# Patient Record
Sex: Male | Born: 1940 | Race: White | Hispanic: No | Marital: Married
Health system: Southern US, Community
[De-identification: ages and names within clinical notes are randomized; demographics above are authoritative.]

---

## 1999-10-23 ENCOUNTER — Encounter: Payer: Self-pay | Admitting: Family Medicine

## 1999-10-23 ENCOUNTER — Ambulatory Visit (HOSPITAL_COMMUNITY): Admission: RE | Admit: 1999-10-23 | Discharge: 1999-10-23 | Payer: Self-pay | Admitting: Family Medicine

## 2004-10-15 ENCOUNTER — Ambulatory Visit: Payer: Self-pay | Admitting: Family Medicine

## 2005-05-30 ENCOUNTER — Ambulatory Visit: Payer: Self-pay | Admitting: Family Medicine

## 2006-02-03 ENCOUNTER — Ambulatory Visit: Payer: Self-pay | Admitting: Family Medicine

## 2006-02-13 ENCOUNTER — Ambulatory Visit: Payer: Self-pay | Admitting: Family Medicine

## 2008-05-24 ENCOUNTER — Encounter: Admission: RE | Admit: 2008-05-24 | Discharge: 2008-05-24 | Payer: Self-pay

## 2010-08-29 ENCOUNTER — Ambulatory Visit: Payer: Self-pay | Admitting: Thoracic Surgery

## 2010-08-29 ENCOUNTER — Encounter: Admission: RE | Admit: 2010-08-29 | Discharge: 2010-08-29 | Payer: Self-pay | Admitting: Thoracic Surgery

## 2010-09-14 ENCOUNTER — Ambulatory Visit: Payer: Self-pay | Admitting: Thoracic Surgery

## 2010-09-14 ENCOUNTER — Ambulatory Visit (HOSPITAL_COMMUNITY): Admission: RE | Admit: 2010-09-14 | Discharge: 2010-09-14 | Payer: Self-pay | Admitting: Thoracic Surgery

## 2010-09-18 ENCOUNTER — Ambulatory Visit: Payer: Self-pay | Admitting: Thoracic Surgery

## 2010-09-25 ENCOUNTER — Ambulatory Visit (HOSPITAL_COMMUNITY): Admission: RE | Admit: 2010-09-25 | Discharge: 2010-09-25 | Payer: Self-pay | Admitting: Thoracic Surgery

## 2010-10-03 ENCOUNTER — Ambulatory Visit: Payer: Self-pay | Admitting: Thoracic Surgery

## 2010-10-17 ENCOUNTER — Ambulatory Visit
Admission: RE | Admit: 2010-10-17 | Discharge: 2010-11-21 | Payer: Self-pay | Source: Home / Self Care | Attending: Radiation Oncology | Admitting: Radiation Oncology

## 2011-01-03 ENCOUNTER — Ambulatory Visit: Payer: Self-pay | Admitting: Radiation Oncology

## 2011-01-24 ENCOUNTER — Ambulatory Visit: Payer: Medicare Other | Admitting: Radiation Oncology

## 2011-02-05 LAB — APTT: aPTT: 32 seconds (ref 24–37)

## 2011-02-05 LAB — CBC
HCT: 45.2 % (ref 39.0–52.0)
Hemoglobin: 15.2 g/dL (ref 13.0–17.0)
MCV: 95 fL (ref 78.0–100.0)
RBC: 4.76 MIL/uL (ref 4.22–5.81)
RDW: 14.3 % (ref 11.5–15.5)
WBC: 10.5 10*3/uL (ref 4.0–10.5)

## 2011-02-06 LAB — AFB CULTURE WITH SMEAR (NOT AT ARMC): Acid Fast Smear: NONE SEEN

## 2011-02-06 LAB — COMPREHENSIVE METABOLIC PANEL
ALT: 16 U/L (ref 0–53)
AST: 27 U/L (ref 0–37)
Alkaline Phosphatase: 75 U/L (ref 39–117)
CO2: 29 mEq/L (ref 19–32)
Chloride: 104 mEq/L (ref 96–112)
GFR calc Af Amer: >60 mL/min (ref >60)
GFR calc non Af Amer: 56 mL/min — ABNORMAL LOW (ref >60)
Glucose, Bld: 113 mg/dL — ABNORMAL HIGH (ref 70–99)
Potassium: 4.3 mEq/L (ref 3.5–5.1)
Sodium: 140 mEq/L (ref 135–145)

## 2011-02-06 LAB — CULTURE, RESPIRATORY W GRAM STAIN

## 2011-02-06 LAB — CBC
HCT: 43.3 % (ref 39.0–52.0)
Hemoglobin: 14.5 g/dL (ref 13.0–17.0)
RBC: 4.57 MIL/uL (ref 4.22–5.81)
WBC: 10.3 10*3/uL (ref 4.0–10.5)

## 2011-02-06 LAB — FUNGUS CULTURE W SMEAR

## 2011-02-06 LAB — GLUCOSE, CAPILLARY: Glucose-Capillary: 130 mg/dL — ABNORMAL HIGH (ref 70–99)

## 2011-02-06 LAB — PROTIME-INR: Prothrombin Time: 12.8 seconds (ref 11.6–15.2)

## 2011-04-09 NOTE — Letter (Signed)
August 29, 2010   Tanvir A. Chodri, MD  [address], [place] [address]   Re:  Tyler Hardy, Tyler Hardy                DOB:  December 18, 1940   Dear Dr. Blenda Nicely:   I appreciate the opportunity of seeing the patient.  This 70 year old  patient was admitted to the hospital in early September with marked  dyspnea with marked bradycardia.  He was apparently in the ICU for  approximately 8 days and then was transferred to the floor and then to a  nursing home with a diagnosis of chronic obstructive pulmonary disease,  tobacco abuse, cardiomegaly, congestive heart failure, and hypertension.  He had pulmonary function tests that showed an FVC of 2.05 which was 31%  of predicted and an FEV1 which is 1.58 which is also 32% of predicted.  He continues to smoke up to a pack a day.  His diffusion capacity was  not done.  He also has been treated for sleep apnea.  At the time of his  admission to the hospital, a CT scan showed a 1.6-cm lesion in the left  upper lobe. A PET scan was done at [facility], which showed no evidence  of spread to the hilar or mediastinal nodes.  He has had no hemoptysis,  fever, chills, or excessive sputum.   MEDICATIONS:  Albuterol, Flexeril, hydralazine, isosorbide, Lasix,  Lovenox, Lidoderm patch, metformin, Neurontin, Novolin, omeprazole,  hydrochlorothiazide, Seroquel, simvastatin, Spiriva, vitamin D, Zoloft,  Indocin, oxycodone, Ativan, and Senokot.   PAST MEDICAL HISTORY:  In addition to the above diagnoses, he has got  diabetes mellitus type 2, chronic back and foot pain, and chronic venous  disease.   FAMILY HISTORY:  Noncontributory.   SOCIAL HISTORY:  He is disabled.  He smokes a pack and half cigarettes a  day.  Does not drink alcohol on a regular basis.   REVIEW OF SYSTEMS:  VITAL SIGNS:  He is 230 pounds.  He is 6 feet.  GENERAL:  His weight has been stable.  CARDIAC:  See history of present illness.  PULMONARY:  No hemoptysis.  See history of present  illness.  GI:  No nausea, vomiting, or constipation, but has reflux.  GU:  No kidney disease, dysuria, or frequent urination.  VASCULAR:  Has venous stasis disease and no TIAs.  NEUROLOGICAL:  No dizziness, headaches, blackouts, or seizures.  MUSCULOSKELETAL:  He has got multiple back and foot pain.  PSYCHIATRIC:  No depression or nervousness.  EYE/ENT:  No changes in eyesight or hearing.  HEMATOLOGICAL:  No problems with bleeding or clotting disorders, but he  is on Lovenox.  No anemia.   PHYSICAL EXAMINATION:  General:  He is an obese Caucasian male, in no  acute distress.  Vital Signs:  His blood pressure is 156/82, pulse 88,  respirations were 18, sats were 94%.  Head, Eyes, Ears, Nose, and  Throat:  Unremarkable.  Chest:  Increased AP diameter and bilateral  wheezes.  Heart:  Regular, sinus rhythm.  Abdomen:  Obese.  Bowel sounds  are decreased.  Extremities:  There is 2+ edema with marked venous  stasis changes bilaterally, right greater than left, 1+ pulses.  Neurological:  He is oriented x3.  Grossly sensory and motor intact.   I feel that the patient is not an operative candidate given his multiple  medical problems particularly with his chronic obstructive pulmonary  disease, cardiac disease, and his morbid obesity.  The only treatment  for this would be with SBRT.  I will plan to try and get a diagnosis  with electromagnetic navigation bronchoscopy.  If that is unsuccessful,  then hopefully we might be able to do a needle biopsy.  We will proceed  with the electromagnetic bronchoscopy on the 21st.  I appreciate the  opportunity of seeing the patient.   Sincerely,   Ines Bloomer, M.D.  Electronically Signed   DPB/MEDQ  D:  08/29/2010  T:  08/30/2010  Job:  [id number]   cc:   Molly Maduro L. Sol Passer, MD

## 2011-04-09 NOTE — Letter (Signed)
October 03, 2010   Tyler Hardy, M.D.  [address]  [place], [place]. [address]   Re:  [name], [name]                DOB:  Jun 03, 1941   Dear Dr. Blenda Hardy:   I saw the patient back today after his needle biopsy.  Fortunately we  were able to give diagnosis and he had no complications with needle  biopsy.  He has a high-grade poorly differentiated neuroendocrine tumor,  which is an intermediate grade between a large cell and a small cell.  So, it needed to be treated with radiation and chemotherapy.  I will  refer him to [facility] to initiate treatment.  His blood  pressure is 175/91, pulse 78, respirations 18, saturations were 97%.   Ines Bloomer, M.D.  Electronically Signed   DPB/MEDQ  D:  10/03/2010  T:  10/04/2010  Job:  [id number]

## 2011-04-09 NOTE — Letter (Signed)
September 18, 2010   Tanvir A. Chodri, MD  [address]  [place], [place]. [address]   Re:  Tyler Hardy, Tyler Hardy                DOB:  08/01/1941   Dear Dr. Blenda Nicely:   I saw the patient back after his bronchoscopy with electromagnetic  navigation, and unfortunately we were not able to obtain a diagnosis and  so I am referring him for a needle biopsy.  He did have strep pneumonia,  so I recommended that he be started on antibiotics for that.  He  tolerated the bronchoscopy well, but unfortunately we were not able to  obtain a diagnosis. We will see him back  following the results of his  needle biopsy.  His blood pressure was 114/66, pulse 88, respirations  16, sats were 92%.   Ines Bloomer, M.D.  Electronically Signed   DPB/MEDQ  D:  09/18/2010  T:  09/19/2010  Job:  [id number]

## 2011-08-20 DIAGNOSIS — Z0271 Encounter for disability determination: Secondary | ICD-10-CM

## 2011-10-26 DEATH — deceased

## 2012-02-01 IMAGING — CT CT CHEST SUPER D W/O CM
2 of 3 series · 14 of 31 positions shown, 16 images · non-contrast
Comparison: CT chest from [facility] dated 07/22/2010

CLINICAL DATA: Left upper lobe mass, follow-up, former smoker

CHEST CT WITHOUT CONTRAST
TECHNIQUE: Multidetector CT imaging of the chest was performed
following the standard protocol without intravenous contrast.

[Series 3: routine chest · axial · 0.77mm/px · z∈[-256,-31]mm · 6 of 64 slices shown, 8 images]
[im 10/64  mediastinal]
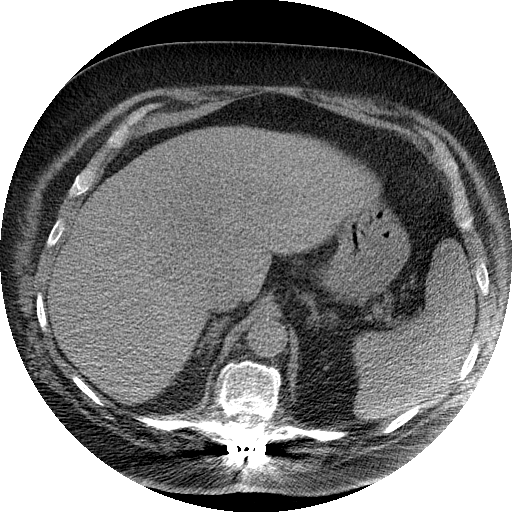
[im 10/64  lung]
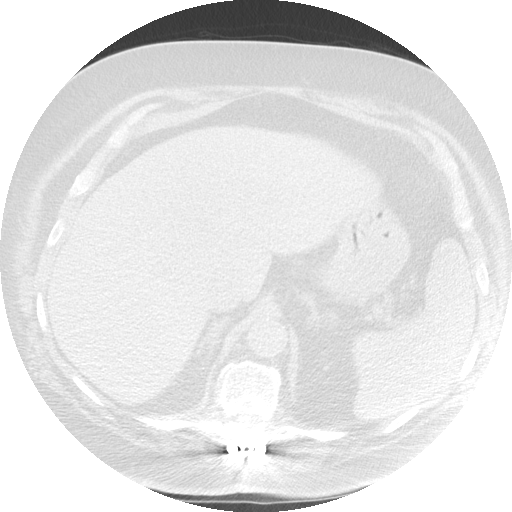
[im 19/64  lung]
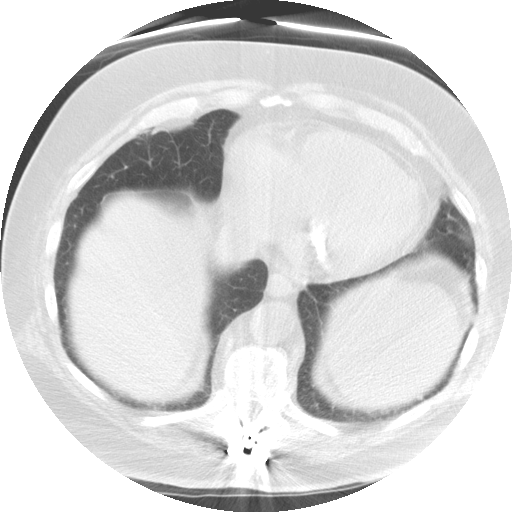
[im 28/64  lung]
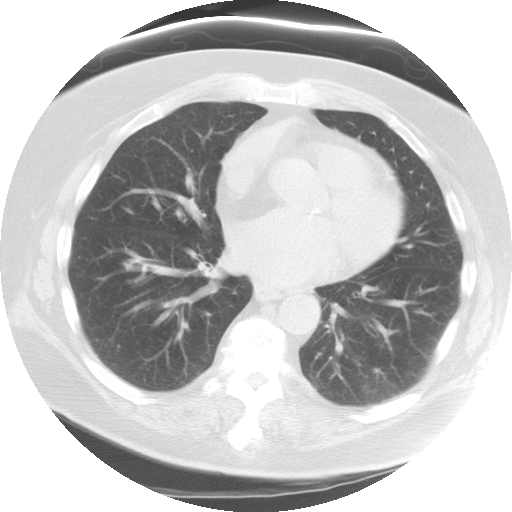
[im 37/64  lung]
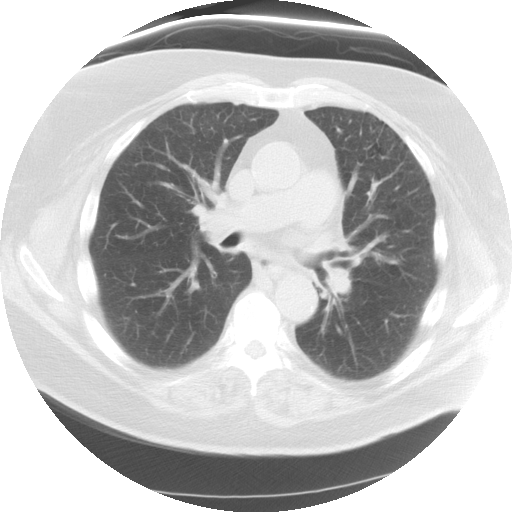
[im 46/64  mediastinal]
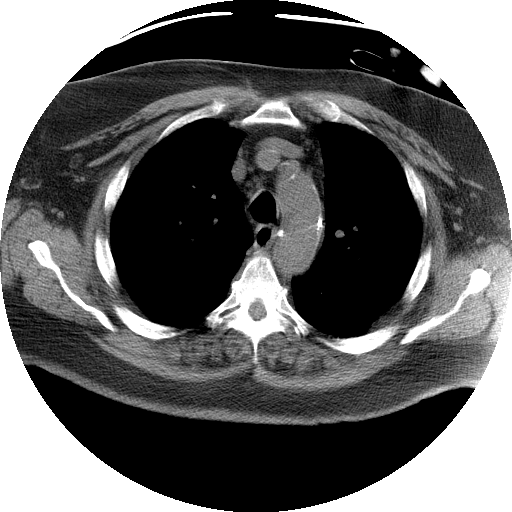
[im 46/64  lung]
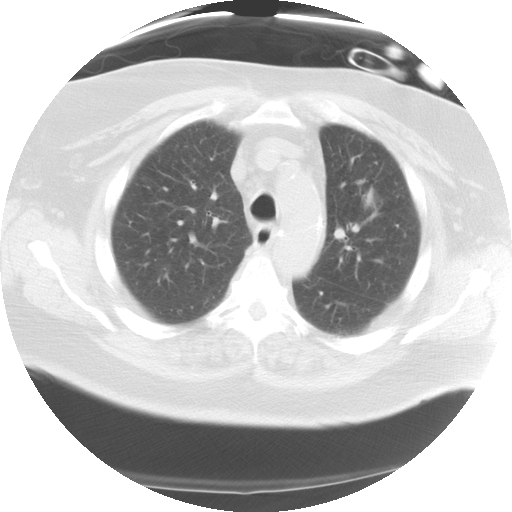
[im 55/64  lung]
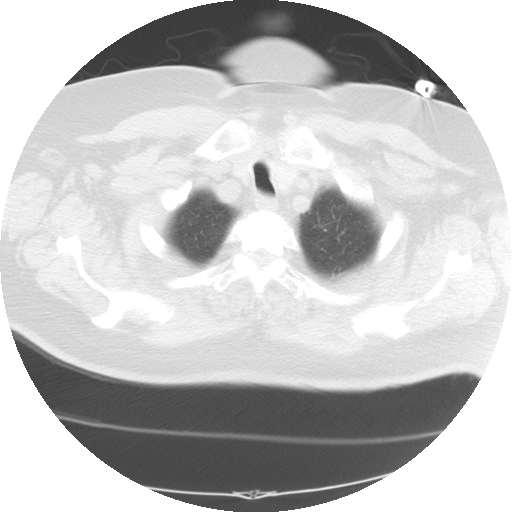

[Series 602: sagittal body · sagittal · 0.77mm/px · 8 of 159 slices shown]
[im 17/159  mediastinal]
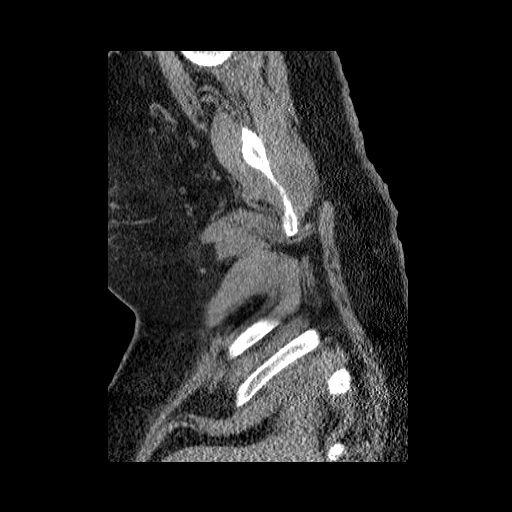
[im 34/159  mediastinal]
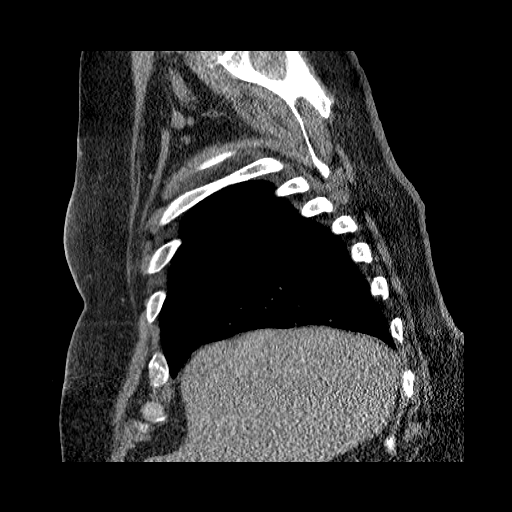
[im 50/159  mediastinal]
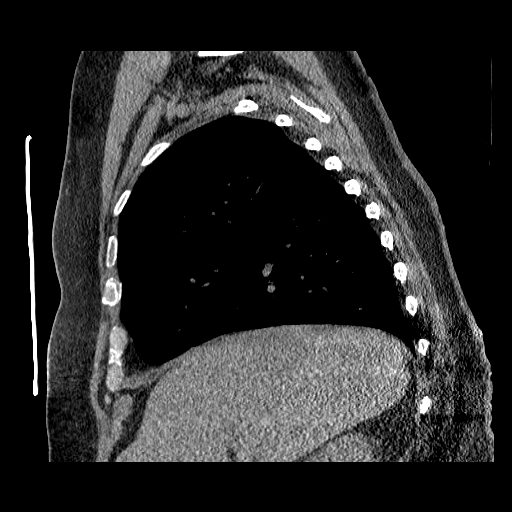
[im 75/159  mediastinal]
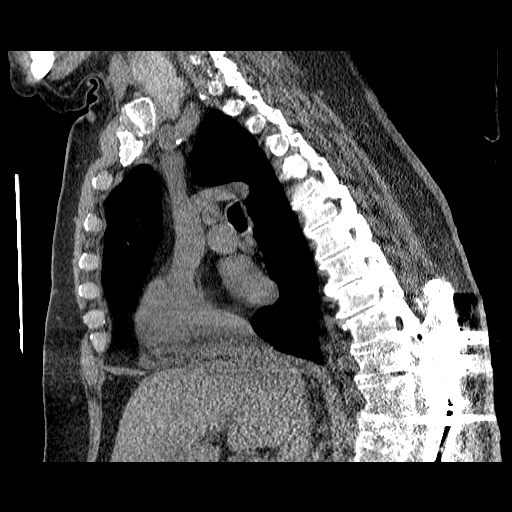
[im 84/159  mediastinal]
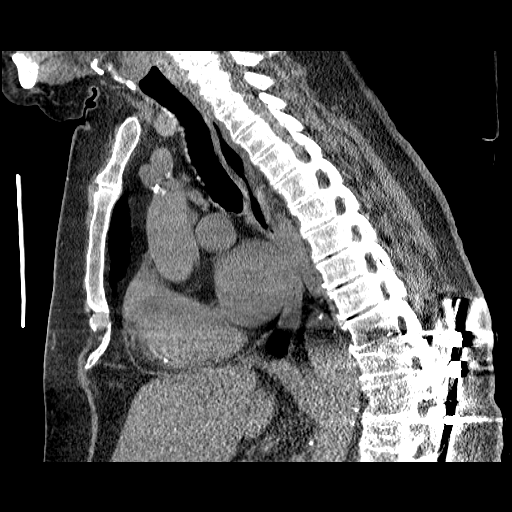
[im 109/159  mediastinal]
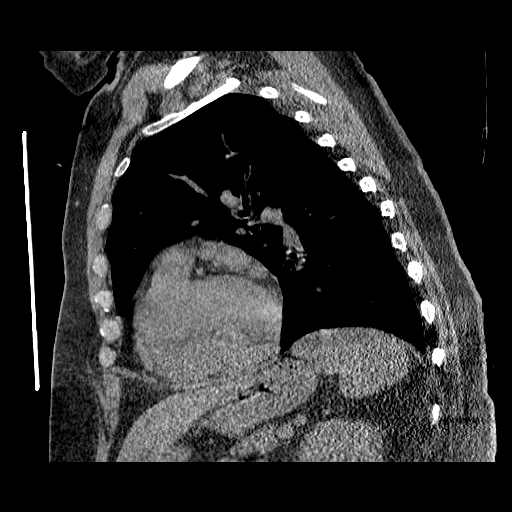
[im 125/159  mediastinal]
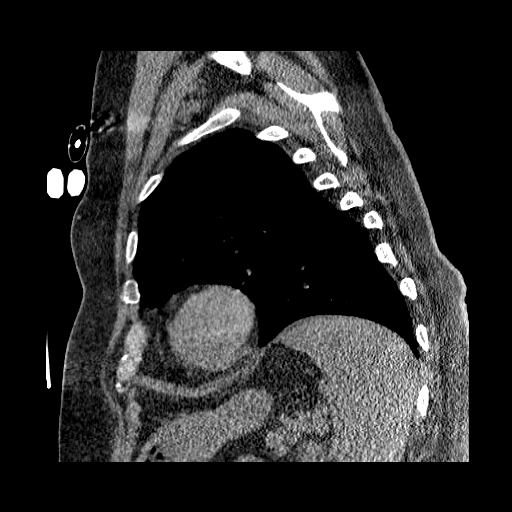
[im 142/159  mediastinal]
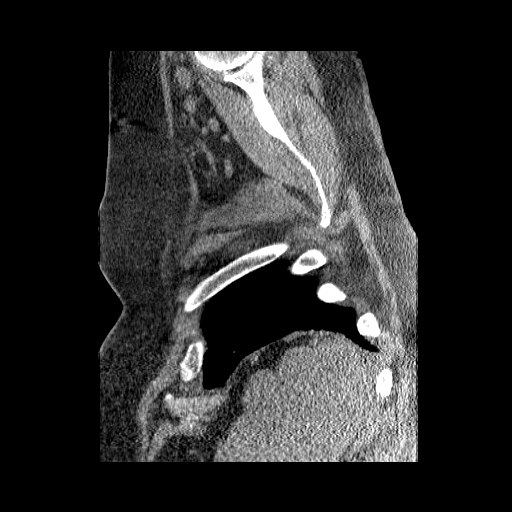

[14 of 31 positions shown; findings below may reference images not displayed]

FINDINGS: The nodule noted within the left upper lobe is stable in
size measuring 15 x 15 x 15 mm and is consistent with primary lung
carcinoma.  The vague nodular opacity noted in the superior aspect
of the right middle lobe is not seen.  There is however within the
anterior right upper lobe a small nodular opacity present which is
not seen in review of the prior CT the chest.  A contralateral
metastatic lesion cannot be excluded.  No other definite new
pulmonary nodule is seen.  No pleural effusion is noted.  The
previously described aortopulmonary the left hilar nodes appears
smaller.  No enlarging mediastinal or hilar adenopathy is seen.
The AP window node now measures 8 x 13 mm compared to 27 x 15 mm
previously, and the left hilar node is not visualized. Mild
cardiomegaly is stable.  A low attenuation lesion is noted
involving the upper pole of the left kidney on images through the
upper abdomen.  On prior CT abdomen pelvis from [facility],
this has been shown to represent a left upper pole renal cyst.
IMPRESSION: 1.  Stable 15 mm irregular lesion in the left upper lobe consistent
with primary lung carcinoma.
2.  Apparent new small nodular opacity in the anterior right upper
lobe worrisome for metastatic lesion.  Recommend continued follow-
up.
3.  Decrease in mediastinal adenopathy.

## 2012-02-17 IMAGING — CR DG CHEST 1V PORT
2 series · 2 of 2 positions shown · non-contrast
Comparison: 09/12/2010

CLINICAL DATA: Left upper lobe mass.  Postoperative evaluation.

PORTABLE CHEST - 1 VIEW

[AP (1 of 2)]
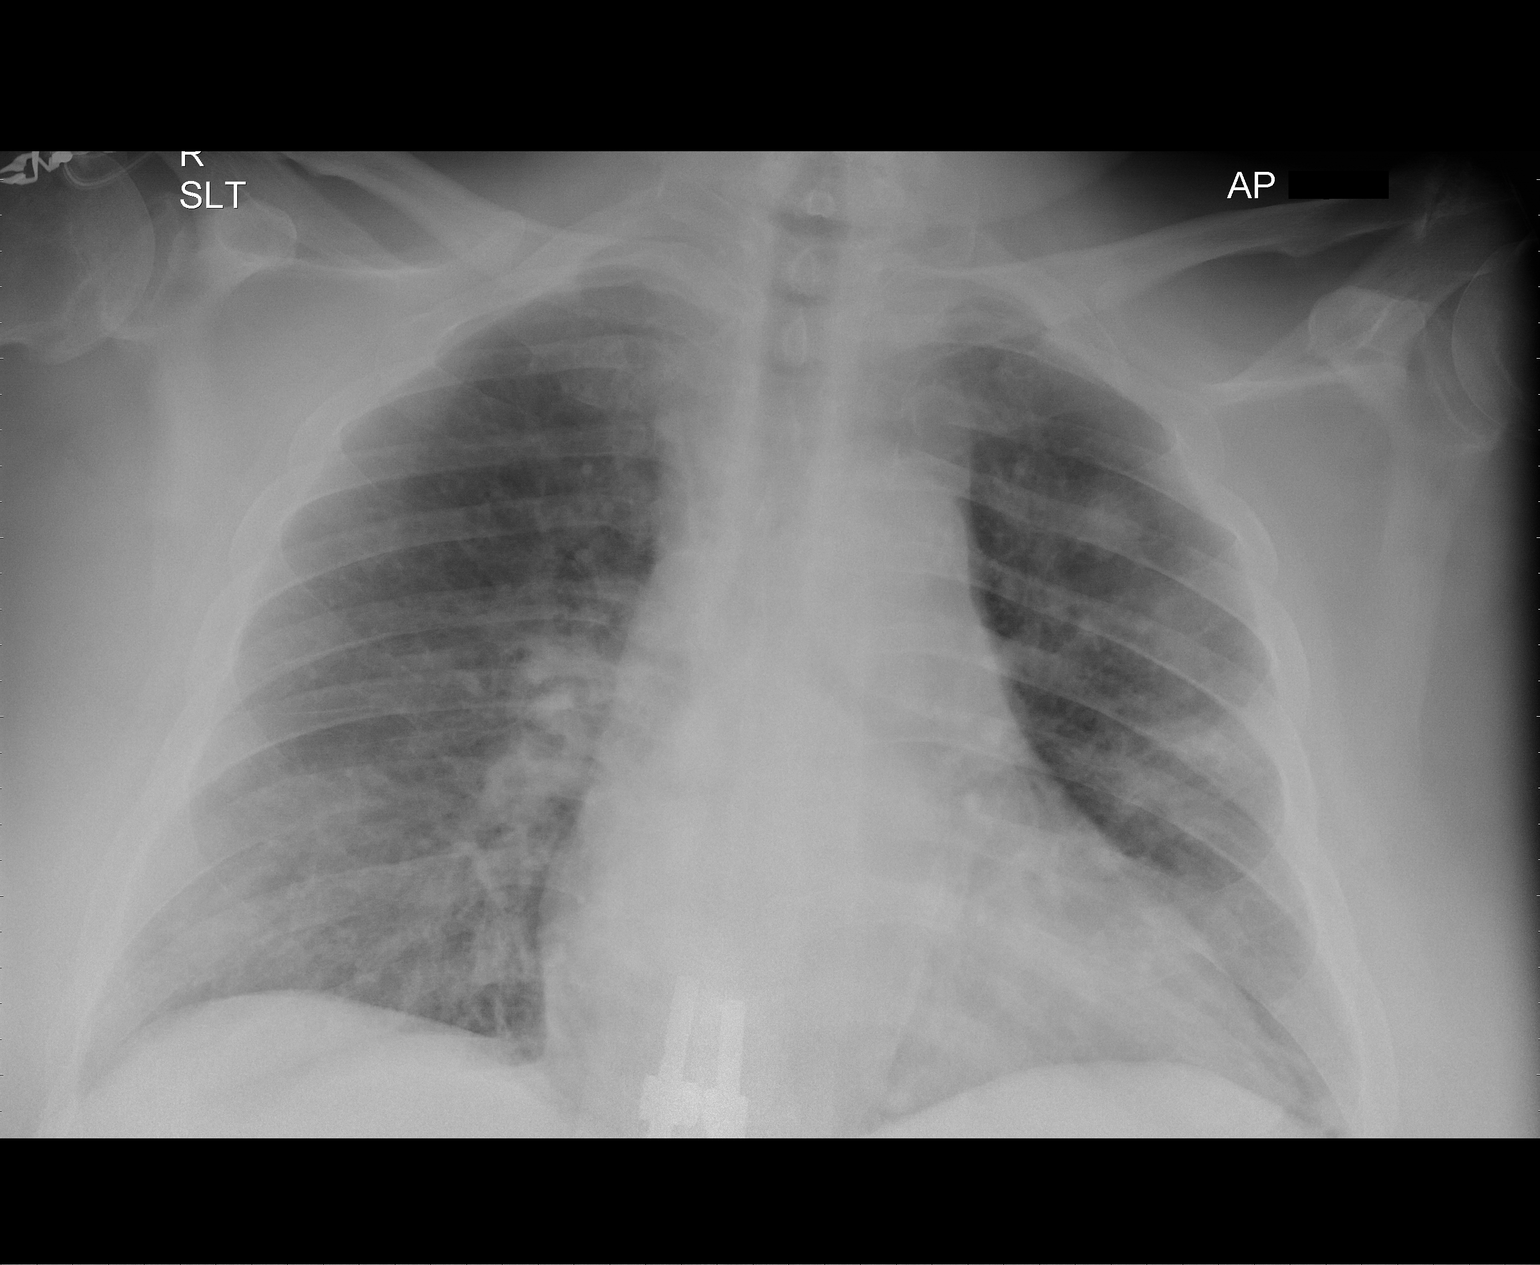

[AP (2 of 2)]
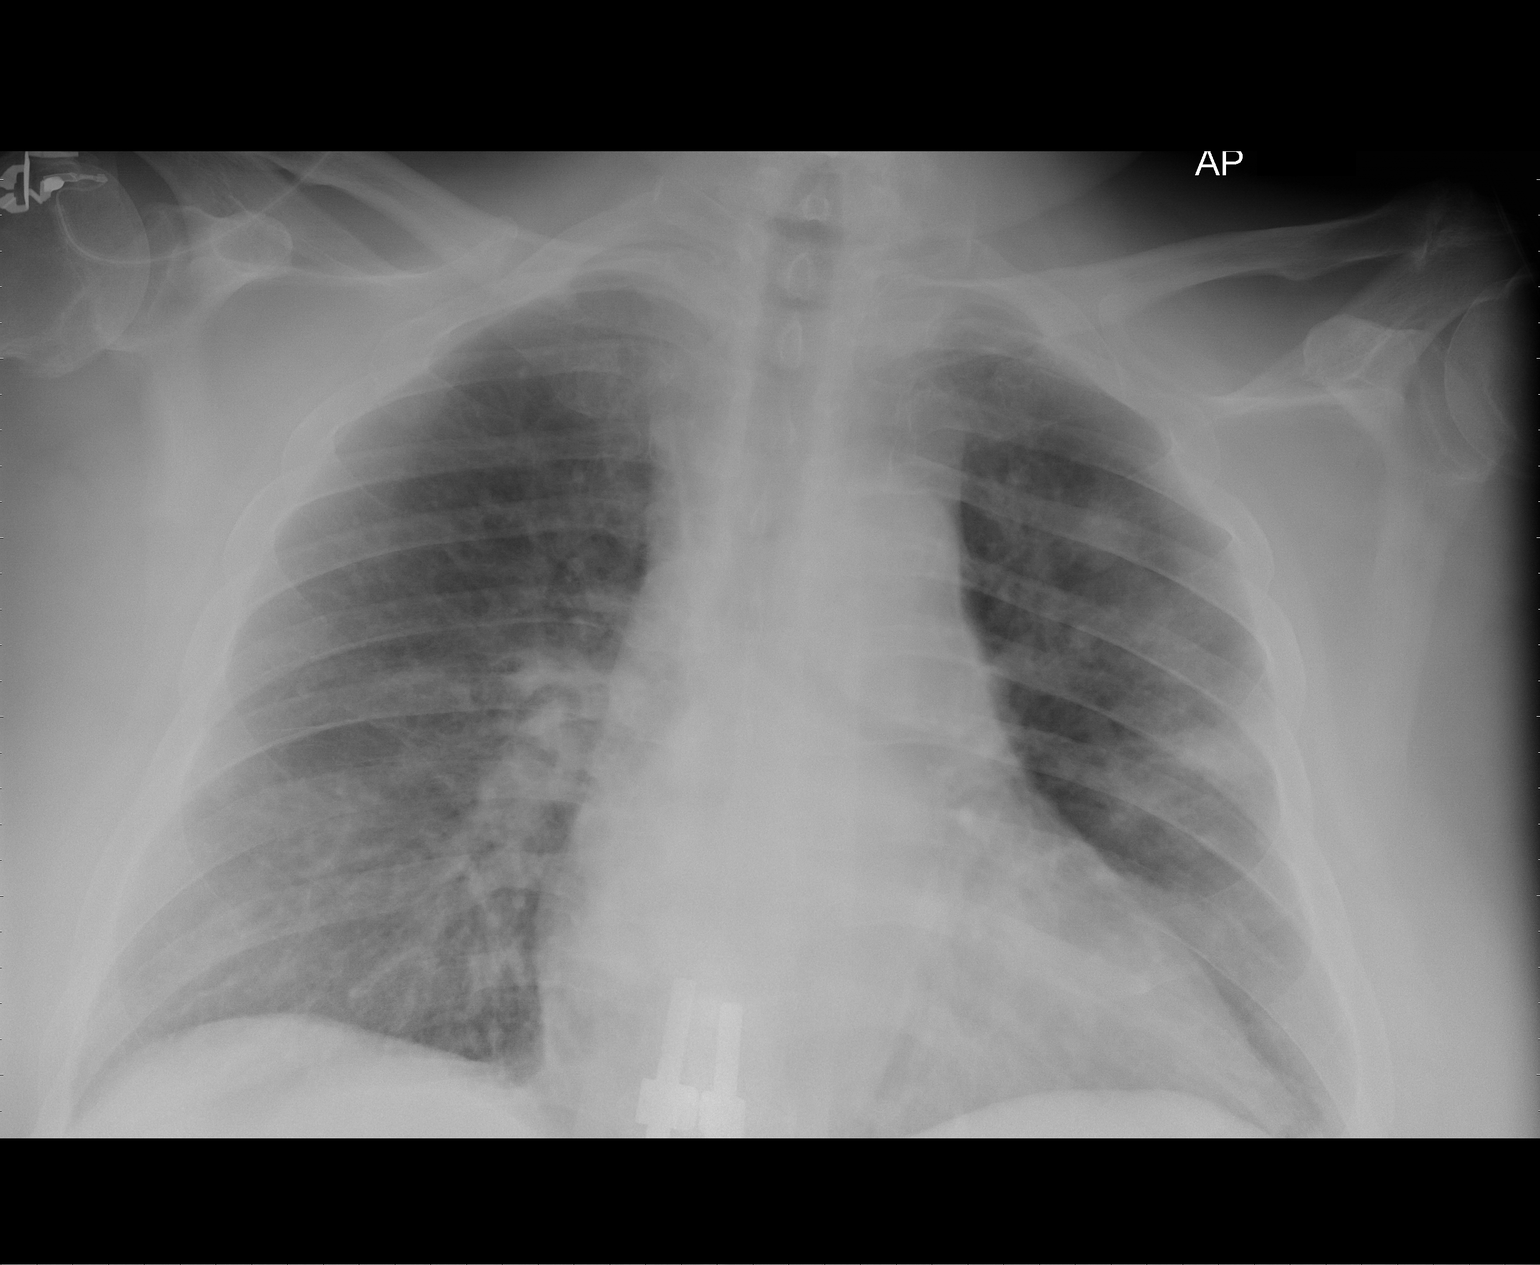

[2 of 2 positions shown; findings below may reference images not displayed]

FINDINGS: Ill-defined opacities again seen in the left upper lobe.
There are new rounded areas of opacity seen in the left lung.
Pulmonary vascular congestion is noted.  There is no pneumothorax.
The heart remains enlarged but is unchanged.  Fusion rods are
partially imaged in the spine.  Carotid artery calcifications are
again noted.
IMPRESSION: 1.  Unchanged left upper lobe pulmonary nodule.  No pneumothorax
following bronchoscopy.

2.  Interval appearance of mild pulmonary vascular congestion.  New
patchy left lung consolidation is seen, likely related to
atelectasis although, aspiration and hemorrhage cannot be excluded.

## 2015-11-13 ENCOUNTER — Telehealth: Payer: Self-pay | Admitting: *Deleted

## 2015-11-13 NOTE — Telephone Encounter (Signed)
On 11-13-15 fax medical records [facility] it was consult note, end of tx note, sim & tx note
